# Patient Record
Sex: Male | Born: 1965 | Race: White | Hispanic: No | Marital: Married | Smoking: Never smoker
Health system: Southern US, Community
[De-identification: ages and names within clinical notes are randomized; demographics above are authoritative.]

## PROBLEM LIST (undated history)

## (undated) DIAGNOSIS — K5792 Diverticulitis of intestine, part unspecified, without perforation or abscess without bleeding: Secondary | ICD-10-CM

## (undated) DIAGNOSIS — E119 Type 2 diabetes mellitus without complications: Secondary | ICD-10-CM

## (undated) DIAGNOSIS — K611 Rectal abscess: Secondary | ICD-10-CM

## (undated) DIAGNOSIS — K529 Noninfective gastroenteritis and colitis, unspecified: Secondary | ICD-10-CM

## (undated) DIAGNOSIS — I10 Essential (primary) hypertension: Secondary | ICD-10-CM

## (undated) HISTORY — PX: PLEURAL SCARIFICATION: SHX748

## (undated) HISTORY — PX: OTHER SURGICAL HISTORY: SHX169

## (undated) HISTORY — PX: HERNIA REPAIR: SHX51

---

## 2007-12-21 ENCOUNTER — Ambulatory Visit (HOSPITAL_BASED_OUTPATIENT_CLINIC_OR_DEPARTMENT_OTHER): Admission: RE | Admit: 2007-12-21 | Discharge: 2007-12-21 | Payer: Self-pay | Admitting: Orthopedic Surgery

## 2010-06-04 ENCOUNTER — Inpatient Hospital Stay (HOSPITAL_COMMUNITY): Admission: EM | Admit: 2010-06-04 | Discharge: 2010-06-06 | Payer: Self-pay | Admitting: Emergency Medicine

## 2010-08-01 ENCOUNTER — Ambulatory Visit (HOSPITAL_COMMUNITY): Admission: RE | Admit: 2010-08-01 | Discharge: 2010-08-01 | Payer: Self-pay | Admitting: Gastroenterology

## 2011-02-16 ENCOUNTER — Inpatient Hospital Stay (HOSPITAL_COMMUNITY)
Admission: EM | Admit: 2011-02-16 | Discharge: 2011-02-17 | DRG: 183 | Discharge status: Against Medical Advice | Payer: BC Managed Care – PPO | Attending: Internal Medicine | Admitting: Internal Medicine

## 2011-02-16 ENCOUNTER — Emergency Department (HOSPITAL_COMMUNITY): Payer: BC Managed Care – PPO

## 2011-02-16 DIAGNOSIS — K5732 Diverticulitis of large intestine without perforation or abscess without bleeding: Principal | ICD-10-CM | POA: Diagnosis present

## 2011-02-16 LAB — DIFFERENTIAL
Basophils Absolute: 0 10*3/uL (ref 0.0–0.1)
Basophils Absolute: 0 10*3/uL (ref 0.0–0.1)
Basophils Absolute: 0 10*3/uL (ref 0.0–0.1)
Basophils Relative: 0 % (ref 0–1)
Basophils Relative: 0 % (ref 0–1)
Eosinophils Absolute: 0.1 10*3/uL (ref 0.0–0.7)
Eosinophils Absolute: 0 10*3/uL (ref 0.0–0.7)
Eosinophils Relative: 1 % (ref 0–5)
Eosinophils Relative: 0 % (ref 0–5)
Eosinophils Relative: 1 % (ref 0–5)
Lymphocytes Relative: 15 % (ref 12–46)
Lymphs Abs: 0.5 10*3/uL — ABNORMAL LOW (ref 0.7–4.0)
Monocytes Absolute: 0.7 10*3/uL (ref 0.1–1.0)
Monocytes Relative: 11 % (ref 3–12)
Monocytes Relative: 5 % (ref 3–12)
Neutro Abs: 6.9 10*3/uL (ref 1.7–7.7)
Neutro Abs: 3.7 10*3/uL (ref 1.7–7.7)
Neutro Abs: 7.2 10*3/uL (ref 1.7–7.7)

## 2011-02-16 LAB — CLOSTRIDIUM DIFFICILE EIA: C difficile Toxins A+B, EIA: NEGATIVE

## 2011-02-16 LAB — COMPREHENSIVE METABOLIC PANEL
ALT: 43 U/L (ref 0–53)
Albumin: 4.1 g/dL (ref 3.5–5.2)
Calcium: 8.8 mg/dL (ref 8.4–10.5)
Chloride: 101 mEq/L (ref 96–112)
Creatinine, Ser: 1.01 mg/dL (ref 0.4–1.5)
GFR calc Af Amer: >60 mL/min (ref >60)
GFR calc non Af Amer: >60 mL/min (ref >60)
Sodium: 134 mEq/L — ABNORMAL LOW (ref 135–145)
Total Protein: 7.1 g/dL (ref 6.0–8.3)

## 2011-02-16 LAB — CBC
HCT: 41.2 % (ref 39.0–52.0)
HCT: 38.9 % — ABNORMAL LOW (ref 39.0–52.0)
HCT: 39.5 % (ref 39.0–52.0)
HCT: 44.2 % (ref 39.0–52.0)
Hemoglobin: 13.8 g/dL (ref 13.0–17.0)
Hemoglobin: 15.5 g/dL (ref 13.0–17.0)
MCH: 30.3 pg (ref 26.0–34.0)
MCH: 31.7 pg (ref 26.0–34.0)
MCHC: 34.9 g/dL (ref 30.0–36.0)
MCHC: 35 g/dL (ref 30.0–36.0)
MCHC: 35 g/dL (ref 30.0–36.0)
MCV: 88.6 fL (ref 78.0–100.0)
MCV: 89.2 fL (ref 78.0–100.0)
Platelets: 133 10*3/uL — ABNORMAL LOW (ref 150–400)
Platelets: 140 10*3/uL — ABNORMAL LOW (ref 150–400)
RBC: 4.43 MIL/uL (ref 4.22–5.81)
RBC: 4.99 MIL/uL (ref 4.22–5.81)
RDW: 13.3 % (ref 11.5–15.5)
RDW: 13.4 % (ref 11.5–15.5)
RDW: 13.5 % (ref 11.5–15.5)

## 2011-02-16 LAB — BASIC METABOLIC PANEL
BUN: 12 mg/dL (ref 6–23)
BUN: 3 mg/dL — ABNORMAL LOW (ref 6–23)
CO2: 26 mEq/L (ref 19–32)
CO2: 24 mEq/L (ref 19–32)
Calcium: 9 mg/dL (ref 8.4–10.5)
Calcium: 8.6 mg/dL (ref 8.4–10.5)
Chloride: 105 mEq/L (ref 96–112)
Creatinine, Ser: 0.86 mg/dL (ref 0.4–1.5)
Creatinine, Ser: 1.02 mg/dL (ref 0.4–1.5)
Creatinine, Ser: 1.14 mg/dL (ref 0.4–1.5)
GFR calc non Af Amer: >60 mL/min (ref >60)
GFR calc non Af Amer: >60 mL/min (ref >60)
Glucose, Bld: 133 mg/dL — ABNORMAL HIGH (ref 70–99)
Glucose, Bld: 104 mg/dL — ABNORMAL HIGH (ref 70–99)
Glucose, Bld: 121 mg/dL — ABNORMAL HIGH (ref 70–99)
Glucose, Bld: 98 mg/dL (ref 70–99)
Potassium: 4.2 mEq/L (ref 3.5–5.1)
Potassium: 3.3 mEq/L — ABNORMAL LOW (ref 3.5–5.1)
Potassium: 4.1 mEq/L (ref 3.5–5.1)
Sodium: 135 mEq/L (ref 135–145)
Sodium: 140 mEq/L (ref 135–145)

## 2011-02-16 LAB — URINALYSIS, ROUTINE W REFLEX MICROSCOPIC
Glucose, UA: NEGATIVE mg/dL
Nitrite: NEGATIVE
Specific Gravity, Urine: 1.023 (ref 1.005–1.030)
Urobilinogen, UA: 0.2 mg/dL (ref 0.0–1.0)
Urobilinogen, UA: 0.2 mg/dL (ref 0.0–1.0)
pH: 6 (ref 5.0–8.0)

## 2011-02-16 LAB — FECAL LACTOFERRIN, QUANT

## 2011-02-16 LAB — URINE MICROSCOPIC-ADD ON

## 2011-02-16 LAB — LIPASE, BLOOD: Lipase: 22 U/L (ref 11–59)

## 2011-02-16 LAB — HEMOGLOBIN A1C: Mean Plasma Glucose: 114 mg/dL (ref <117)

## 2011-02-16 MED ORDER — IOHEXOL 300 MG/ML  SOLN
125.0000 mL | Freq: Once | INTRAMUSCULAR | Status: AC | PRN
  Administered 2011-02-16: 125 mL via INTRAVENOUS

## 2011-02-17 LAB — CBC
MCH: 29.4 pg (ref 26.0–34.0)
MCV: 88.1 fL (ref 78.0–100.0)
Platelets: 195 10*3/uL (ref 150–400)
RDW: 12.6 % (ref 11.5–15.5)

## 2011-02-17 LAB — URINE CULTURE: Culture: NO GROWTH

## 2011-02-17 LAB — BASIC METABOLIC PANEL
BUN: 8 mg/dL (ref 6–23)
Chloride: 100 mEq/L (ref 96–112)
Creatinine, Ser: 0.94 mg/dL (ref 0.4–1.5)
GFR calc non Af Amer: >60 mL/min (ref >60)
Glucose, Bld: 113 mg/dL — ABNORMAL HIGH (ref 70–99)

## 2011-02-18 NOTE — Discharge Summary (Signed)
  Steven James, Steven James             ACCOUNT NO.:  [id number]  MEDICAL RECORD NO.:  [id number]           PATIENT TYPE:  I  LOCATION:  1308                         FACILITY:  [facility]  PHYSICIAN:  Hillery Aldo, M.D.   DATE OF BIRTH:  09/11/1966  DATE OF ADMISSION:  02/16/2011 DATE OF DISCHARGE:  02/17/2011                              DISCHARGE SUMMARY   PRIMARY CARE PHYSICIAN:  Windle Guard, M.D.  DISCHARGE DIAGNOSES: 1. Sigmoid diverticulitis with microperforation, inadequately treated     secondary to the patient leaving against medical advice. 2. History of bronchitis.  DISCHARGE MEDICATIONS:  No medications prescribed at discharge as the patient left AMA.  PREADMISSION MEDICATIONS: 1. Adderall 20 mg p.o. t.i.d. 2. Hyoscyamine p.r.n.  CONSULTATIONS:  Telephone consultation made with Dr. Claud Kelp.  BRIEF ADMISSION HISTORY OF PRESENT ILLNESS:  The patient is a 45 year old male who presented to the hospital with a chief complaint of abdominal pain, nausea, vomiting, and intermittent chills associated with diaphoresis.  Upon initial evaluation in the emergency department, the patient was found to have acute diverticulitis with microperforation and was referred to the hospitalist service for further inpatient evaluation and treatment.  PROCEDURES AND DIAGNOSTIC STUDIES: 1. Acute abdominal series on February 16, 2011, showed no evidence of     active pulmonary disease.  Nonobstructive bowel gas pattern.     Nondilated gas-filled small bowel loops in the left upper quadrant     suggestive of localized ileus or gastroenteritis. 2. CT scan of the abdomen and pelvis on February 16, 2011, showed acute     sigmoid diverticulitis.  Small extraluminal gas bowel consistent     with microperforation.  Free fluid layering in the pelvis and a     small right lower quadrant fluid collection noted.  DISCHARGE LABORATORY VALUES:  Urine cultures were negative.  Sodium was 137, potassium  3.9, chloride 100, bicarb 30, BUN 8, creatinine 0.94, glucose 113, calcium 8.9.  White blood cell count was 8.7, hemoglobin 13.9, hematocrit 41.6, platelets 195.  HOSPITAL COURSE BY PROBLEM:  Sigmoid diverticulitis:  The patient was admitted and put on Invanz after telephone consultation was made with Dr. Derrell Lolling.  Plan was to continue IV antibiotics and to slowly advance his diet as tolerated unless his condition deteriorated in which case a formal surgical consultation would be obtained. Unfortunately, the patient had family issues and left against medical advice on February 17, 2011. History of bronchitis:  No active issues during the course of his hospital stay.  DISPOSITION:  The patient left against medical advice.     Hillery Aldo, M.D.     CR/MEDQ  D:  02/18/2011  T:  02/18/2011  Job:  [id number]  cc:   Windle Guard, M.D. Fax: [phone]  Electronically Signed by Hillery Aldo M.D. on 02/18/2011 82:95:62 PM

## 2011-02-18 NOTE — H&P (Signed)
Steven James, Steven James             ACCOUNT NO.:  [id number]  MEDICAL RECORD NO.:  [id number]           PATIENT TYPE:  I  LOCATION:  1308                         FACILITY:  [facility]  PHYSICIAN:  Hillery Aldo, M.D.   DATE OF BIRTH:  [birth year]  DATE OF ADMISSION:  02/16/2011 DATE OF DISCHARGE:                             HISTORY & PHYSICAL   PRIMARY CARE PHYSICIAN:  Windle Guard, M.D.  CHIEF COMPLAINT:  Abdominal pain.  HISTORY OF PRESENT ILLNESS:  The patient is a 45 year old male with a past medical history of colitis, treated approximately 1 year ago, who presented to the hospital with a 4-day history of gradual onset of abdominal pain.  The pain has been intermittently accompanied by dry heaves and 1 episode of nausea and vomiting approximately 3 days ago. He reports intermittent chills and diaphoresis, though he is unsure if he has been febrile.  Because he has not had a thermometer to check his temperature.  He describes the abdominal pain is intermittent and sharp, 10/10, at it is worst pain, and located mainly in the bilateral lower quadrants.  The pain at times radiates to the scrotum and has been associated with myalgias as well.  He last moved his bowels approximately 4 days ago and used a Fleet Enema to attempt to evacuate, but was fairly unsuccessful.  He reports no blood in his stool, black stools, or hematemesis.  During his last episode of colitis 1 year ago, he reportedly had a colonoscopy that was negative for any evidence of inflammatory bowel disease.  Upon evaluation here in the emergency department, the patient had a CT scan done, which showed sigmoid diverticulitis with a microperforation and he subsequently has been referred to the hospitalist service for further inpatient evaluation and treatment.  PAST MEDICAL HISTORY: 1. Bronchitis. 2. Pneumothorax 3. History of colitis, approximately 1 year ago, status post     colonoscopy, negative for inflammatory  bowel disease.  PAST SURGICAL HISTORY: 1. Carpal tunnel release. 2. Hernia repair.  FAMILY HISTORY:  The patient's father is alive at 63, has diverticulosis and a history of prostate cancer.  The patient's mother is alive at 15 and has congestive heart failure, COPD, hypertension, and a brain tumor that they think is benign.  He does have a 1 half-sister who he does not keep in touch with.  SOCIAL HISTORY:  The patient is married and lives in [place] with his wife.  He has worked as a Radiation protection practitioner and a Buyer, retail in the past, but is currently unemployed.  He smoked cigars and a pipe for 15 years, but quit 4 years ago, and now only rarely smokes cigars.  He denies any drug use and drinks alcohol moderately on social occasions.  ALLERGIES:  HORSE SERUM causes anaphylaxis.  CURRENT MEDICATIONS: 1. Adderall 20 mg p.o. t.i.d. 2. Hyoscyamine prescribed yesterday for abdominal spasm, but has not     consistently taken.  REVIEW OF SYSTEMS:  Comprehensive 14-point review of systems is as noted in the elements of the HPI and past medical history above, otherwise completely negative.  PHYSICAL EXAMINATION:  VITAL SIGNS:  Temperature 98.3, pulse 73,  respirations 20, blood pressure 125/71, O2 saturation 96% on room air. GENERAL:  A well-developed, well-nourished, slightly obese Caucasian male in no acute distress. HEENT:  Normocephalic, atraumatic.  PERRLA.  EOMI.  Oropharynx is clear. NECK:  Supple, no thyromegaly, no lymphadenopathy, no jugular venous distention. CHEST:  Lungs clear to auscultation bilaterally with good air movement. No wheezes, rhonchi, or rales. HEART:  Regular rate, rhythm.  No murmurs, rubs, or gallops. ABDOMEN:  Soft in the upper quadrant and slightly distended in the lower quadrants.  He is diffusely tender to palpation.  Bowel sounds are positive. EXTREMITIES:  No clubbing, edema, or cyanosis. SKIN:  Warm and dry.  No rashes. NEUROLOGIC:  Alert and  oriented x3.  Cranial nerves II through XII grossly intact.  Nonfocal.  DATA REVIEW: 1. Acute abdominal series shows no evidence of active pulmonary     disease.  Nonobstructive bowel gas pattern.  Nondilated gas-filled     small bowel loops in the left upper quadrant, suggestive of a     localized ileus or gastroenteritis. 2. CT scan of the abdomen and pelvis showed acute sigmoid     diverticulitis with a small amount of extraluminal gas consistent     with mitral perforation.  Free-fluid layering in the pelvis and a     small right lower quadrant fluid collection noted.  LABORATORY DATA:  White blood cell count is 9.5, hemoglobin 14.2, hematocrit 41.2, platelets 189.  Sodium is 136, potassium 4.2, chloride 103, bicarb 26, BUN 12, creatinine 0.86, calcium 9.0, glucose 133. Urinalysis is negative for nitrites and leukocytes.  ASSESSMENT/PLAN: 1. Sigmoid diverticulitis with microperforation:  The patient will be     admitted and started on empiric antibiotics consisting of Invanz     per recommendations of Dr. Claud Kelp.  The patient does not     have any evidence of a colovesical fistula, given his clean     urinalysis and his abdominal exam is benign.  He does not appear to     have acute abdomen and he is nontoxic in appearing and after     discussing the case with Dr. Derrell Lolling, the plan will be to admit him     for bowel rest, IV antibiotics, and surgical consultation on a     formal basis if his condition deteriorates over the next 24 hours.     The patient will also be put on stool softeners, given his chronic     constipation and family history of diverticulosis. 2. History of bronchitis:  The patient's lungs are currently clear     with no evidence of bronchospasm. 3. Prophylaxis:  The patient will be placed on PAS hoses for deep     venous thrombosis prophylaxis in case he does have to go to     surgery.  Time spent on admission including face-to-face time equals  approximately 1 hour.     Hillery Aldo, M.D.     CR/MEDQ  D:  02/16/2011  T:  02/17/2011  Job:  [id number]  cc:   Windle Guard, M.D. Fax: [phone]  Electronically Signed by Hillery Aldo M.D. on 02/18/2011 03:27:37 PM

## 2011-04-15 NOTE — Op Note (Signed)
NAMEBARRE, AYDELOTT             ACCOUNT NO.:  [id number]   MEDICAL RECORD NO.:  [id number]          PATIENT TYPE:  AMB   LOCATION:  DSC                          FACILITY:  [facility]   PHYSICIAN:  Katy Fitch. Sypher, M.D. DATE OF BIRTH:  Jun 20, 1966   DATE OF PROCEDURE:  12/21/2007  DATE OF DISCHARGE:                               OPERATIVE REPORT   PREOPERATIVE DIAGNOSIS:  Painful right hand aggravated by lifting, with  magnetic resonance imaging evidence of mass within median nerve right  carpal tunnel, with episodic median nerve compression symptoms.   POSTOPERATIVE DIAGNOSIS:  Large-caliber persistent median artery,  measuring approximately 4 mm off tourniquet.   OPERATION:  Decompression of right median nerve, and confirmation of  persistent median artery without thrombosis.   OPERATIONS:  Katy Fitch. Sypher, M.D.   ASSISTANT:  Marveen Reeks. Dasnoit PA-C.   ANESTHESIA:  General by LMA.   SUPERVISING ANESTHESIOLOGIST:  Zenon Mayo, M.D.   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   He was referred through the courtesy of Dr. Louanna Raw for evaluation  and management of this predicament.   Clinical examination revealed elements of probable carpal tunnel  syndrome.  However, he did not have the usual provocative symptoms.   Due to his persistent pain, an MRI of the right hand and wrist was  obtained which documented a T2 enhancing mass within the region of the  median nerve.   This could represent a hamartoma, a ganglion within the nerve, or a  possible persistent median artery.   Given the fact that we could not palpate this mass through the  transverse carpal ligament, we recommended exploration with either  resection or decompression.  After informed consent. Mr. Leiner is  brought to the operating room at this time.   PROCEDURE:  Steven James is brought to the operating room and  placed  in the supine position upon the operating table.   Following the induction of general anesthesia by LMA technique, the  right arm was prepped with Betadine soap and solution and sterilely  draped.  A pneumatic tourniquet was applied to the proximal right  brachium.   Following exsanguination of the right arm with Esmarch bandage, the  arterial tourniquet was inflated to 230 mmHg.  The procedure commenced  with a traditional carpal tunnel incision, paralleling the thenar  crease.  Subcutaneous tissues were carefully divided, taking care to  identify the palmar fascia.  The fascia was split in the line of its  fibers to reveal the common sensory branches of the median nerve and the  superficial palmar arch.   The transverse carpal ligament was then isolated from the median nerve  elements and released along its ulnar border.  A Joseph skin hook was  used elevate the radial leaflet of the transverse carpal ligament,  followed by careful inspection of the median nerve.  A Henner elevator  was used to gently tease synovium off of the nerve, and immediately a  large-caliber vascular structure was noted.  Initially, this had  the  appearance of a possible ganglion.  However, upon further dissection, it  was noted be a large-caliber artery measuring 3-4 mm in diameter, on  tourniquet, with accompanying veins.  Care was taken not to disturb the  epineurium.   After this was confirmed, photographic documentation of the vascular  anomaly was accomplished with a digital camera, followed by release of  the tourniquet.  Off tourniquet, the caliber of the vessel increased to  over 4 mm.  Bleeding was controlled with bipolar cautery under saline.   The canal was inspected, and no other masses or predicaments noted.   Our plan for treatment of this will be simple decompression by release  of the transverse carpal ligament.  All visible bleeding points were  electrocauterized with bipolar  current, followed by repair the skin with  segmental intradermal 3-0 Prolene suture.   A compressive dressing was applied with a volar plaster splint  incorporated, maintaining the wrist in 20 degrees of dorsiflexion.  There were no apparent complications.   For aftercare, Mr. Longton is provided a prescription for Percocet 5  mg 1 p.o. q.4-6 h. p.r.n. pain, 20 tablets, without refill.   I will see him back for followup in approximately 1 week.  The nature of  his predicament was explained to Mr. Torosyan wife in detail.      Katy Fitch Sypher, M.D.  Electronically Signed     RVS/MEDQ  D:  12/21/2007  T:  12/21/2007  Job:  [id number]   cc:   Louanna Raw

## 2011-08-21 LAB — POCT HEMOGLOBIN-HEMACUE: Hemoglobin: 14.7

## 2011-09-03 IMAGING — CR DG ABDOMEN ACUTE W/ 1V CHEST
4 series · 4 of 4 positions shown · non-contrast
Comparison: None.

CLINICAL DATA: Abdominal pain.

ACUTE ABDOMEN SERIES (ABDOMEN 2 VIEW & CHEST 1 VIEW)

[w chest pa]
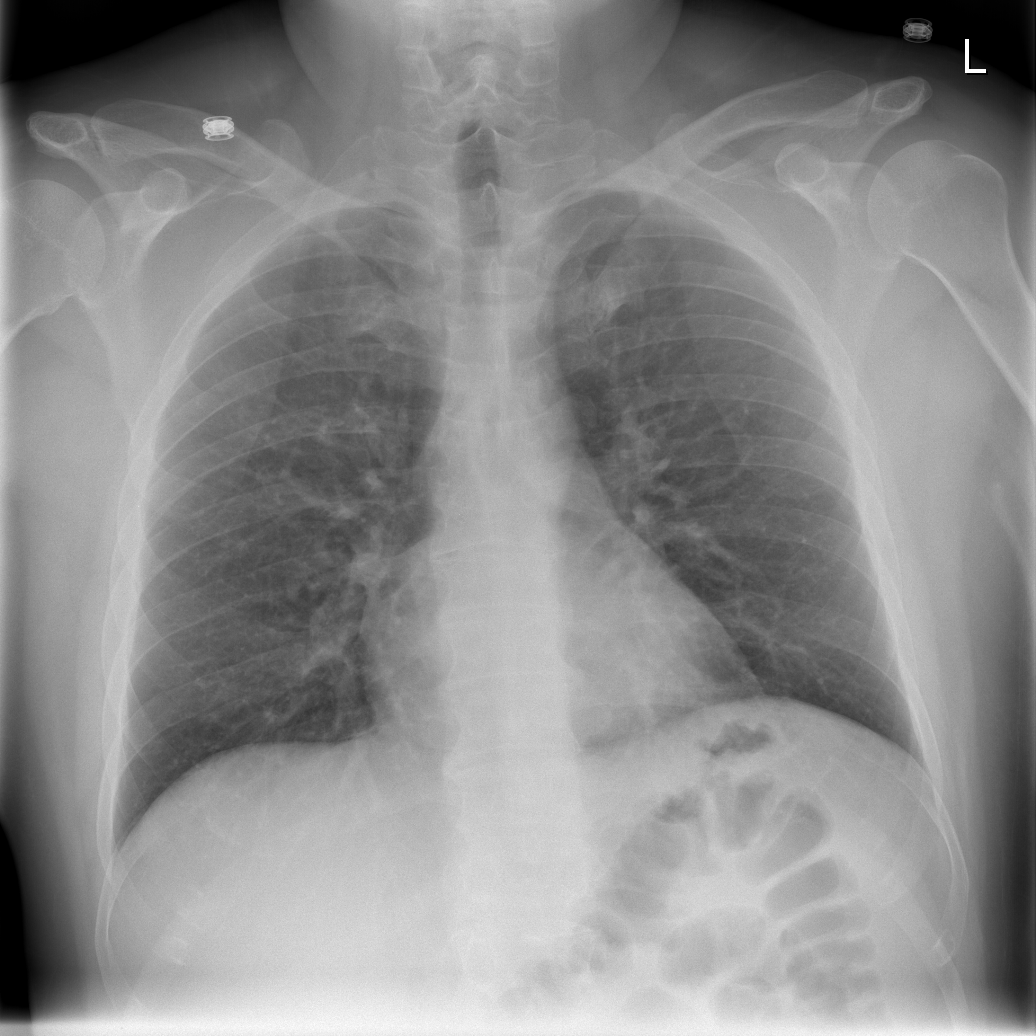

[w abdomen upright *]
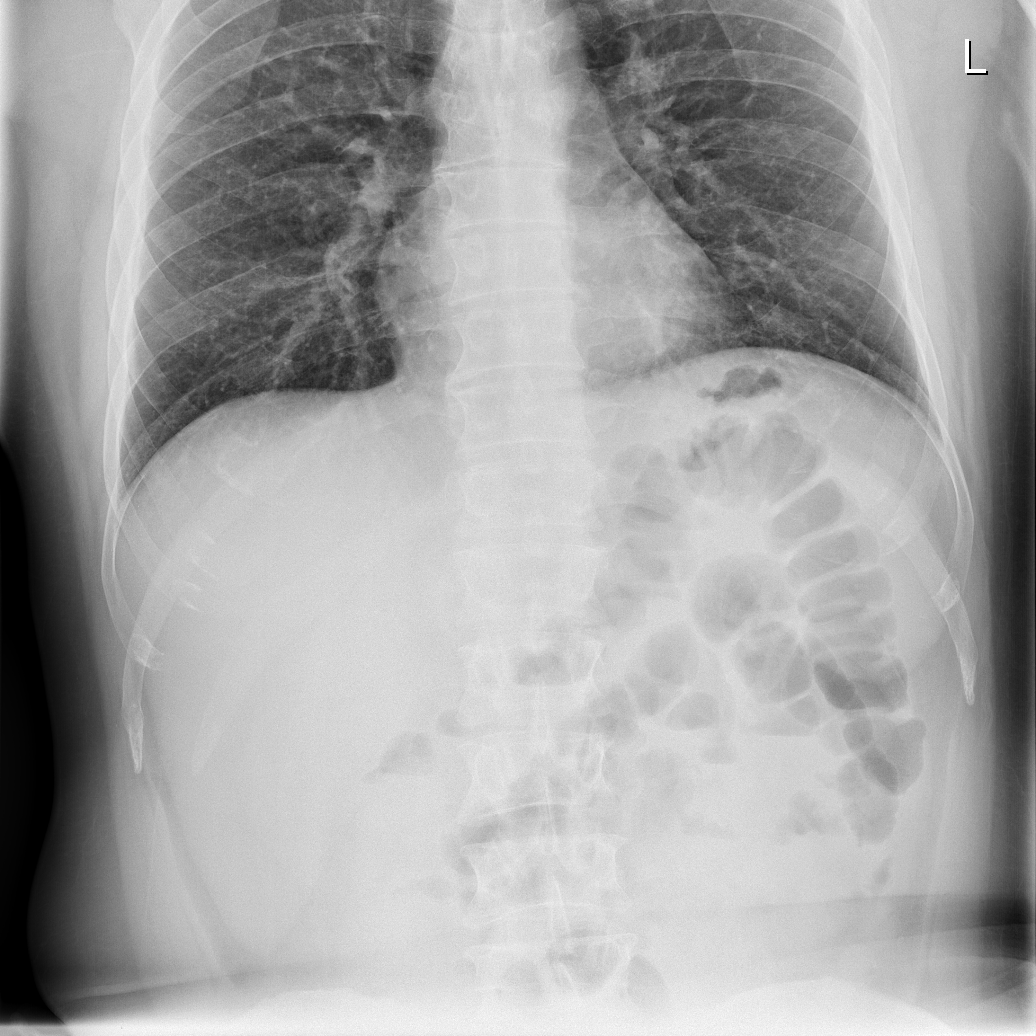

[t abdomen supine (1 of 2)]
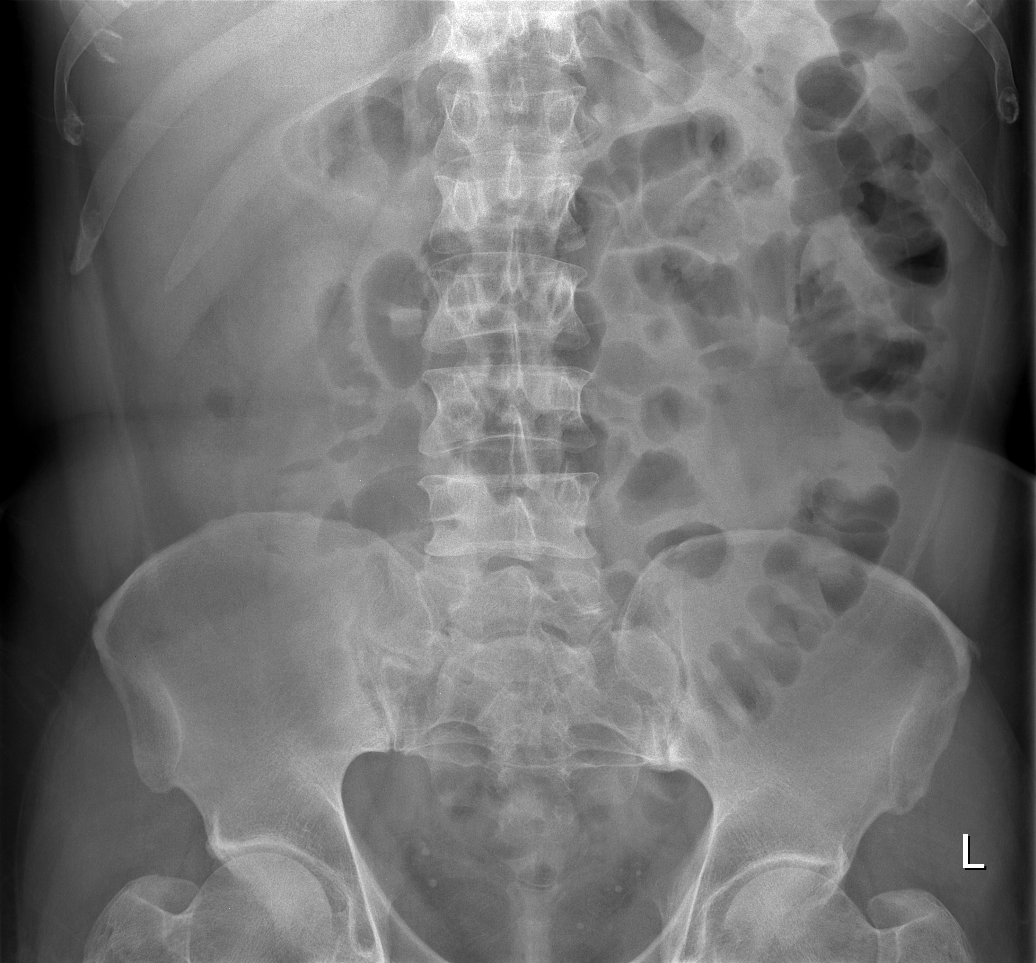

[t abdomen supine (2 of 2)]
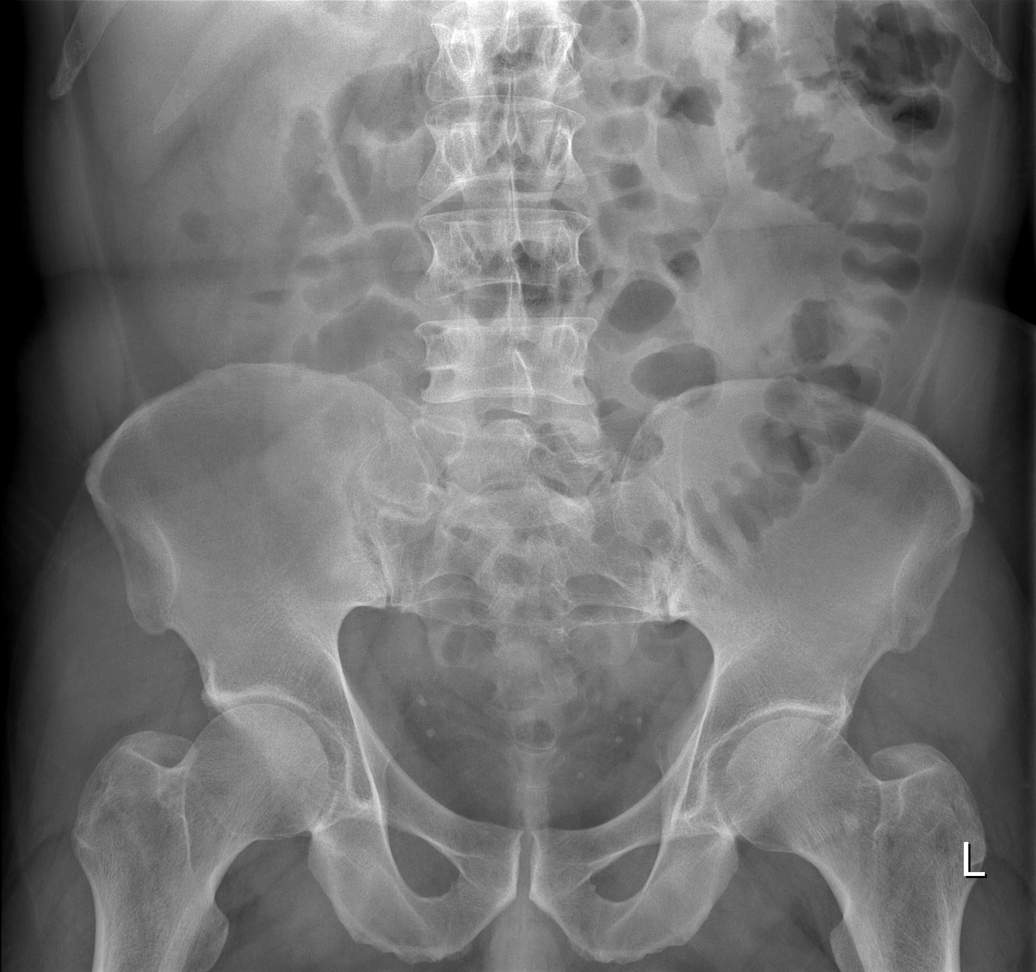

[4 of 4 positions shown; findings below may reference images not displayed]

FINDINGS: The heart and lungs appear normal.  There is no free air
or free fluid in the abdomen.  There is air scattered throughout
nondistended loops of large or small bowel.  There are numerous
calcifications in the pelvis most likely phleboliths.  No bony
abnormalities.
IMPRESSION: Benign-appearing abdomen and chest.

## 2014-05-05 ENCOUNTER — Emergency Department (HOSPITAL_COMMUNITY)
Admission: EM | Admit: 2014-05-05 | Discharge: 2014-05-05 | Disposition: A | Discharge status: Home / Self Care | Payer: BC Managed Care – PPO | Attending: Emergency Medicine | Admitting: Emergency Medicine

## 2014-05-05 ENCOUNTER — Encounter (HOSPITAL_COMMUNITY): Payer: Self-pay | Admitting: Emergency Medicine

## 2014-05-05 DIAGNOSIS — K5732 Diverticulitis of large intestine without perforation or abscess without bleeding: Secondary | ICD-10-CM | POA: Insufficient documentation

## 2014-05-05 DIAGNOSIS — N410 Acute prostatitis: Secondary | ICD-10-CM

## 2014-05-05 DIAGNOSIS — Z79899 Other long term (current) drug therapy: Secondary | ICD-10-CM | POA: Insufficient documentation

## 2014-05-05 DIAGNOSIS — K5289 Other specified noninfective gastroenteritis and colitis: Secondary | ICD-10-CM | POA: Insufficient documentation

## 2014-05-05 DIAGNOSIS — Z7901 Long term (current) use of anticoagulants: Secondary | ICD-10-CM | POA: Insufficient documentation

## 2014-05-05 DIAGNOSIS — K612 Anorectal abscess: Secondary | ICD-10-CM | POA: Insufficient documentation

## 2014-05-05 DIAGNOSIS — M549 Dorsalgia, unspecified: Secondary | ICD-10-CM | POA: Insufficient documentation

## 2014-05-05 HISTORY — DX: Diverticulitis of intestine, part unspecified, without perforation or abscess without bleeding: K57.92

## 2014-05-05 HISTORY — DX: Rectal abscess: K61.1

## 2014-05-05 HISTORY — DX: Noninfective gastroenteritis and colitis, unspecified: K52.9

## 2014-05-05 LAB — URINALYSIS, ROUTINE W REFLEX MICROSCOPIC
Bilirubin Urine: NEGATIVE
Glucose, UA: >1000 mg/dL — AB
Hgb urine dipstick: NEGATIVE
Ketones, ur: NEGATIVE mg/dL
Leukocytes, UA: NEGATIVE
Nitrite: NEGATIVE
Protein, ur: NEGATIVE mg/dL
Specific Gravity, Urine: 1.04 — ABNORMAL HIGH (ref 1.005–1.030)
Urobilinogen, UA: 0.2 mg/dL (ref 0.0–1.0)
pH: 6 (ref 5.0–8.0)

## 2014-05-05 LAB — URINE MICROSCOPIC-ADD ON

## 2014-05-05 MED ORDER — OXYCODONE-ACETAMINOPHEN 5-325 MG PO TABS
2.0000 | ORAL_TABLET | Freq: Once | ORAL | Status: DC
  Filled 2014-05-05: qty 2

## 2014-05-05 MED ORDER — OXYCODONE-ACETAMINOPHEN 5-325 MG PO TABS: 1.0000 | ORAL_TABLET | ORAL | Status: AC | PRN

## 2014-05-05 MED ORDER — CIPROFLOXACIN HCL 500 MG PO TABS
500.0000 mg | ORAL_TABLET | Freq: Once | ORAL | Status: AC
  Administered 2014-05-05: 500 mg via ORAL
  Filled 2014-05-05: qty 1

## 2014-05-05 MED ORDER — CIPROFLOXACIN HCL 500 MG PO TABS: 500.0000 mg | ORAL_TABLET | Freq: Two times a day (BID) | ORAL | Status: AC

## 2014-05-05 MED ORDER — IBUPROFEN 800 MG PO TABS
800.0000 mg | ORAL_TABLET | Freq: Once | ORAL | Status: AC
  Administered 2014-05-05: 800 mg via ORAL
  Filled 2014-05-05: qty 1

## 2014-05-05 NOTE — ED Notes (Signed)
Dr. Kohut at bedside 

## 2014-05-05 NOTE — ED Provider Notes (Signed)
CSN: [id number]     Arrival date & time 05/05/14  [id number] History   First MD Initiated Contact with Patient 05/05/14 [phone]     Chief Complaint  Patient presents with  . Rectal Pain   47yM with rectal pain. Onset about 4 days ago. Gradual and progressively worsening. Constant. Reports past history of rectal abscess? with similar symptoms. No fever or chills. No abdominal pain. Urine has been darker in color than normal and strong smelling, otherwise no urinary complaints.   (Consider location/radiation/quality/duration/timing/severity/associated sxs/prior Treatment) HPI  Past Medical History  Diagnosis Date  . Rectal abscess   . Diverticulitis   . Colitis    Past Surgical History  Procedure Laterality Date  . Rectal abscess    . Pleural scarification    . Hernia repair     No family history on file. History  Substance Use Topics  . Smoking status: Never Smoker   . Smokeless tobacco: Not on file  . Alcohol Use: Yes     Comment: socially    Review of Systems  All systems reviewed and negative, other than as noted in HPI.   Allergies  Horse-derived products  Home Medications   Prior to Admission medications   Medication Sig Start Date End Date Taking? Authorizing Provider  acetaminophen (TYLENOL) 500 MG tablet Take 1,000 mg by mouth every 8 (eight) hours as needed for mild pain or headache.   Yes Historical Provider, MD  amphetamine-dextroamphetamine (ADDERALL) 10 MG tablet Take 10 mg by mouth 3 (three) times daily.   Yes Historical Provider, MD  FLUoxetine (PROZAC) 10 MG capsule Take 10 mg by mouth daily.   Yes Historical Provider, MD  losartan (COZAAR) 25 MG tablet Take 25 mg by mouth daily.   Yes Historical Provider, MD  zolpidem (AMBIEN) 10 MG tablet Take 10 mg by mouth at bedtime as needed for sleep.   Yes Historical Provider, MD  ciprofloxacin (CIPRO) 500 MG tablet Take 1 tablet (500 mg total) by mouth every 12 (twelve) hours. 05/05/14   Raeford RazorStephen Owens Hara, MD   oxyCODONE-acetaminophen (PERCOCET/ROXICET) 5-325 MG per tablet Take 1-2 tablets by mouth every 4 (four) hours as needed for severe pain. 05/05/14   Raeford RazorStephen Jakeya Gherardi, MD   BP 118/71  Pulse 86  Temp(Src) 98.2 F (36.8 C) (Oral)  Resp 18  Ht 6' (1.829 m)  Wt 240 lb (108.863 kg)  BMI 32.54 kg/m2  SpO2 95% Physical Exam  Nursing note and vitals reviewed. Constitutional: He appears well-developed and well-nourished. No distress.  HENT:  Head: Normocephalic and atraumatic.  Eyes: Conjunctivae are normal. Right eye exhibits no discharge. Left eye exhibits no discharge.  Neck: Neck supple.  Cardiovascular: Normal rate, regular rhythm and normal heart sounds.  Exam reveals no gallop and no friction rub.   No murmur heard. Pulmonary/Chest: Effort normal and breath sounds normal. No respiratory distress.  Abdominal: Soft. He exhibits no distension. There is no tenderness.  Genitourinary:  No external lesions noted. Normal tone. Prostate enlarged, increased warmth, exquisitely tender.   Musculoskeletal: He exhibits no edema and no tenderness.  Neurological: He is alert.  Skin: Skin is warm and dry.  Psychiatric: He has a normal mood and affect. His behavior is normal. Thought content normal.    ED Course  Procedures (including critical care time) Labs Review Labs Reviewed  URINALYSIS, ROUTINE W REFLEX MICROSCOPIC - Abnormal; Notable for the following:    Specific Gravity, Urine 1.040 (*)    Glucose, UA >1000 (*)  All other components within normal limits  URINE CULTURE  URINE MICROSCOPIC-ADD ON    Imaging Review No results found.   EKG Interpretation None      MDM   Final diagnoses:  Acute prostatitis    47yM with rectal pain, lower back pain and strong smelling urine. On exam exquisitely tender over prostate which is enlarged, warm. Suspected prostatitis, although subsequent UA doesn't necessarily support this. Pt reports history of "rectal abscess" but this was initially  I&D'd at bedside. No hemorrhoids or other explanatory findings. No tenderness on DRE aside from prostate. Will tx for possible prostatitis. Send urine for culture. Abdomen benign. Afebrile. Triage vitals with HR of 104. HR on my exam normal and again upon DC. PRN pain medication. Return precautions discussed.     Raeford Razor, MD 05/13/14 [phone]

## 2014-05-05 NOTE — ED Notes (Signed)
Patient c/o rectal/buttocks pain x3-4 days. Patient states he has a history of rectal abscess.

## 2014-05-05 NOTE — ED Notes (Signed)
Patient ambulatory to bathroom unassisted.

## 2014-05-05 NOTE — ED Notes (Signed)
Pharmacy tech at bedside 

## 2014-05-05 NOTE — ED Notes (Signed)
Pt c/o pain in rectum x 3 -4 days; pt states that it just starting hurting that there was no injury or event that caused the pain; pt states he has had some minimal bright red blood at times; pt states that he has had a rectal abscess in the past and that this feels the same

## 2014-05-07 LAB — URINE CULTURE

## 2021-12-08 ENCOUNTER — Other Ambulatory Visit: Payer: Self-pay

## 2021-12-08 ENCOUNTER — Ambulatory Visit
Admission: EM | Admit: 2021-12-08 | Discharge: 2021-12-08 | Disposition: A | Discharge status: Home / Self Care | Payer: BC Managed Care – PPO | Attending: Physician Assistant | Admitting: Physician Assistant

## 2021-12-08 ENCOUNTER — Encounter: Payer: Self-pay | Admitting: Emergency Medicine

## 2021-12-08 ENCOUNTER — Ambulatory Visit (INDEPENDENT_AMBULATORY_CARE_PROVIDER_SITE_OTHER): Payer: BC Managed Care – PPO

## 2021-12-08 DIAGNOSIS — L03012 Cellulitis of left finger: Secondary | ICD-10-CM

## 2021-12-08 MED ORDER — DOXYCYCLINE HYCLATE 100 MG PO CAPS: 100.0000 mg | ORAL_CAPSULE | Freq: Two times a day (BID) | ORAL | 0 refills | Status: DC

## 2021-12-08 MED ORDER — DOXYCYCLINE HYCLATE 100 MG PO CAPS
100.0000 mg | ORAL_CAPSULE | Freq: Two times a day (BID) | ORAL | 0 refills | Status: DC
Start: 2021-12-08 — End: 2023-03-29

## 2021-12-08 NOTE — ED Triage Notes (Signed)
Pt said x 1` month ago had a piece of metal in the left index finger and thinking something was in the finger. Has been working on trying to work with puncture on the finger but seems to be getting worse over time.

## 2021-12-08 NOTE — ED Provider Notes (Signed)
[facility]    CSN: [id number] Arrival date & time: 12/08/21  0818      History   Chief Complaint Chief Complaint  Patient presents with   Finger Injury    HPI Steven James is a 56 y.o. male.   Patient here today for evaluation of the left index finger distal swelling and erythema that seems to be worsening.  He states about a month ago he had a piece of metal in his left index finger he has been trying to work with the puncture wound but symptoms seem to be worsening.  He does not report fever.  He is a diabetic.  The history is provided by the patient.   Past Medical History:  Diagnosis Date   Colitis    Diverticulitis    Rectal abscess     There are no problems to display for this patient.   Past Surgical History:  Procedure Laterality Date   HERNIA REPAIR     PLEURAL SCARIFICATION     rectal abscess         Home Medications    Prior to Admission medications   Medication Sig Start Date End Date Taking? Authorizing Provider  doxycycline (VIBRAMYCIN) 100 MG capsule Take 1 capsule (100 mg total) by mouth 2 (two) times daily. 12/08/21  Yes Francene Finders, PA-C  acetaminophen (TYLENOL) 500 MG tablet Take 1,000 mg by mouth every 8 (eight) hours as needed for mild pain or headache.    [provider]  amphetamine-dextroamphetamine (ADDERALL) 10 MG tablet Take 10 mg by mouth 3 (three) times daily.    [provider]  ciprofloxacin (CIPRO) 500 MG tablet Take 1 tablet (500 mg total) by mouth every 12 (twelve) hours. 05/05/14   Virgel Manifold, MD  FLUoxetine (PROZAC) 10 MG capsule Take 10 mg by mouth daily.    [provider]  losartan (COZAAR) 25 MG tablet Take 25 mg by mouth daily.    [provider]  oxyCODONE-acetaminophen (PERCOCET/ROXICET) 5-325 MG per tablet Take 1-2 tablets by mouth every 4 (four) hours as needed for severe pain. 05/05/14   Virgel Manifold, MD  zolpidem (AMBIEN) 10 MG tablet Take 10 mg by mouth at  bedtime as needed for sleep.    [provider]    Family History History reviewed. No pertinent family history.  Social History Social History   Tobacco Use   Smoking status: Never  Substance Use Topics   Alcohol use: Yes    Comment: socially   Drug use: No     Allergies   Horse-derived products   Review of Systems Review of Systems  Constitutional:  Negative for chills and fever.  Eyes:  Negative for discharge and redness.  Respiratory:  Negative for shortness of breath.   Musculoskeletal:  Negative for joint swelling.  Skin:  Positive for color change and wound.    Physical Exam Triage Vital Signs ED Triage Vitals  Enc Vitals Group     BP 12/08/21 0831 (!) 156/81     Pulse Rate 12/08/21 0831 92     Resp 12/08/21 0831 16     Temp 12/08/21 0831 98.2 F (36.8 C)     Temp Source 12/08/21 0831 Oral     SpO2 12/08/21 0831 97 %     Weight --      Height --      Head Circumference --      Peak Flow --      Pain Score 12/08/21  [id number]     Pain Loc --      Pain Edu? --      Excl. in [place]? --    No data found.  Updated Vital Signs BP (!) 156/81 (BP Location: Right Arm)    Pulse 92    Temp 98.2 F (36.8 C) (Oral)    Resp 16    SpO2 97%      Physical Exam Vitals and nursing note reviewed.  Constitutional:      General: He is not in acute distress.    Appearance: Normal appearance. He is not ill-appearing.  HENT:     Head: Normocephalic and atraumatic.  Eyes:     Conjunctiva/sclera: Conjunctivae normal.  Cardiovascular:     Rate and Rhythm: Normal rate.  Pulmonary:     Effort: Pulmonary effort is normal.  Skin:    Comments: Swelling and erythema noted to distal left index finger. No active bleeding or drainage noted.   Neurological:     Mental Status: He is alert.     UC Treatments / Results  Labs (all labs ordered are listed, but only abnormal results are displayed) Labs Reviewed - No data to display  EKG   Radiology DG Finger Index  Left  Result Date: 12/08/2021 CLINICAL DATA:  Nonhealing wound distal index finger. EXAM: LEFT INDEX FINGER 2+V COMPARISON:  None. FINDINGS: No evidence for fracture or dislocation. Soft tissue irregularity noted distal index finger. No underlying bony destruction. No radiopaque soft tissue foreign body. No soft tissue gas by x-ray. IMPRESSION: 1. No acute bony abnormality. 2. Soft tissue irregularity noted distally. No radiopaque soft tissue foreign body or soft tissue gas. Electronically Signed   By: Misty Stanley M.D.   On: 12/08/2021 09:12    Procedures Procedures (including critical care time)  Medications Ordered in UC Medications - No data to display  Initial Impression / Assessment and Plan / UC Course  I have reviewed the triage vital signs and the nursing notes.  Pertinent labs & imaging results that were available during my care of the patient were reviewed by me and considered in my medical decision making (see chart for details).    Antibiotics prescribed to treat suspected cellulitis. No FB on xray. Recommend follow up with any further concerns or if symptoms fail to improve with treatment.   Final Clinical Impressions(s) / UC Diagnoses   Final diagnoses:  Cellulitis of finger of left hand   Discharge Instructions   None    ED Prescriptions     Medication Sig Dispense Auth. Provider   doxycycline (VIBRAMYCIN) 100 MG capsule Take 1 capsule (100 mg total) by mouth 2 (two) times daily. 20 capsule Francene Finders, PA-C      PDMP not reviewed this encounter.   Francene Finders, PA-C 12/08/21 [phone]

## 2022-10-31 ENCOUNTER — Ambulatory Visit
Admission: EM | Admit: 2022-10-31 | Discharge: 2022-10-31 | Disposition: A | Discharge status: Home / Self Care | Payer: BC Managed Care – PPO

## 2022-10-31 NOTE — ED Notes (Signed)
Called in lobby no answered.  

## 2023-03-29 ENCOUNTER — Emergency Department (HOSPITAL_COMMUNITY)
Admission: EM | Admit: 2023-03-29 | Discharge: 2023-03-29 | Disposition: A | Discharge status: Home / Self Care | Payer: BC Managed Care – PPO | Attending: Emergency Medicine | Admitting: Emergency Medicine

## 2023-03-29 ENCOUNTER — Emergency Department (HOSPITAL_COMMUNITY): Payer: BC Managed Care – PPO

## 2023-03-29 ENCOUNTER — Encounter (HOSPITAL_COMMUNITY): Payer: Self-pay

## 2023-03-29 DIAGNOSIS — K610 Anal abscess: Secondary | ICD-10-CM | POA: Diagnosis present

## 2023-03-29 DIAGNOSIS — E876 Hypokalemia: Secondary | ICD-10-CM | POA: Diagnosis not present

## 2023-03-29 HISTORY — DX: Type 2 diabetes mellitus without complications: E11.9

## 2023-03-29 HISTORY — DX: Essential (primary) hypertension: I10

## 2023-03-29 LAB — BASIC METABOLIC PANEL
Anion gap: 14 (ref 5–15)
BUN: 15 mg/dL (ref 6–20)
CO2: 21 mmol/L — ABNORMAL LOW (ref 22–32)
Calcium: 9 mg/dL (ref 8.9–10.3)
Chloride: 101 mmol/L (ref 98–111)
Creatinine, Ser: 0.82 mg/dL (ref 0.61–1.24)
GFR, Estimated: >60 mL/min (ref >60)
Glucose, Bld: 127 mg/dL — ABNORMAL HIGH (ref 70–99)
Potassium: 3.3 mmol/L — ABNORMAL LOW (ref 3.5–5.1)
Sodium: 136 mmol/L (ref 135–145)

## 2023-03-29 LAB — CBC WITH DIFFERENTIAL/PLATELET
Abs Immature Granulocytes: 0.03 10*3/uL (ref 0.00–0.07)
Basophils Absolute: 0 10*3/uL (ref 0.0–0.1)
Basophils Relative: 0 %
Eosinophils Absolute: 0.1 10*3/uL (ref 0.0–0.5)
Eosinophils Relative: 1 %
HCT: 44.5 % (ref 39.0–52.0)
Hemoglobin: 15.2 g/dL (ref 13.0–17.0)
Immature Granulocytes: 0 %
Lymphocytes Relative: 17 %
Lymphs Abs: 1.8 10*3/uL (ref 0.7–4.0)
MCH: 29.3 pg (ref 26.0–34.0)
MCHC: 34.2 g/dL (ref 30.0–36.0)
MCV: 85.9 fL (ref 80.0–100.0)
Monocytes Absolute: 0.9 10*3/uL (ref 0.1–1.0)
Monocytes Relative: 9 %
Neutro Abs: 7.5 10*3/uL (ref 1.7–7.7)
Neutrophils Relative %: 73 %
Platelets: 154 10*3/uL (ref 150–400)
RBC: 5.18 MIL/uL (ref 4.22–5.81)
RDW: 13.1 % (ref 11.5–15.5)
WBC: 10.3 10*3/uL (ref 4.0–10.5)
nRBC: 0 % (ref 0.0–0.2)

## 2023-03-29 MED ORDER — IOHEXOL 300 MG/ML  SOLN
100.0000 mL | Freq: Once | INTRAMUSCULAR | Status: AC | PRN
  Administered 2023-03-29: 100 mL via INTRAVENOUS

## 2023-03-29 MED ORDER — LIDOCAINE-EPINEPHRINE (PF) 2 %-1:200000 IJ SOLN
10.0000 mL | Freq: Once | INTRAMUSCULAR | Status: AC
  Administered 2023-03-29: 10 mL
  Filled 2023-03-29: qty 20

## 2023-03-29 MED ORDER — DOXYCYCLINE HYCLATE 100 MG PO CAPS: 100.0000 mg | ORAL_CAPSULE | Freq: Two times a day (BID) | ORAL | 0 refills | Status: DC

## 2023-03-29 MED ORDER — SODIUM CHLORIDE (PF) 0.9 % IJ SOLN
INTRAMUSCULAR | Status: AC
  Filled 2023-03-29: qty 50

## 2023-03-29 MED ORDER — DOXYCYCLINE HYCLATE 100 MG PO CAPS: 100.0000 mg | ORAL_CAPSULE | Freq: Two times a day (BID) | ORAL | 0 refills | Status: AC

## 2023-03-29 MED ORDER — DOXYCYCLINE HYCLATE 100 MG PO TABS
100.0000 mg | ORAL_TABLET | Freq: Once | ORAL | Status: AC
  Administered 2023-03-29: 100 mg via ORAL
  Filled 2023-03-29: qty 1

## 2023-03-29 NOTE — ED Provider Notes (Signed)
Signout from [employer] at shift change. Briefly, patient presents for rectal pain for about 1 day.  Patient initially thought there is a hemorrhoid.  He has had a rectal abscess in the past which required drainage in the OR.  This was about 20 years ago.  He has a history of diabetes.   Plan: Awaiting CT results   7:19 AM Reassessment performed. Patient appears stable.  External exam performed with RN chaperone.  There is some mild induration and tenderness noted but no external fluctuant areas.  Personally reviewed and interpreted CT imaging, agree abscess noted.   Most current vital signs reviewed and are as follows: BP (!) 155/95 (BP Location: Left Arm)   Pulse 91   Temp 98.7 F (37.1 C) (Oral)   Resp 16   Ht 5\' 9"  (1.753 m)   Wt 99.8 kg   SpO2 96%   BMI 32.49 kg/m   Plan: Will discuss with general surgery as currently the area is not amenable to a bedside I&D.  Patient last ate 6 PM yesterday.   8:15 AM Reassessment performed. Patient appears stable.  I discussed case with Dr. Dwain Sarna who looked at the imaging, advised bedside I&D in the ED, antibiotics, packing for 2 days if able.   Patient discussed with and seen by Dr. Durwin Nora.  We used ultrasound to best localize the area of fluid.  Needle aspiration performed.  Procedure per his note.   Plan: Discharge home on doxycycline.  He would like to use over-the-counter medication for pain.  First dose given in the ED.  Patient will need to perform warm soaks 2-3 times a day for 15 minutes.  Patient is aware that if the symptoms begin to worsen again that he should come back to the emergency department or follow-up with general surgery.  Return sooner with fevers.       Renne Crigler, PA-C 03/29/23 0820    Gloris Manchester, MD 03/29/23 [phone]

## 2023-03-29 NOTE — Discharge Instructions (Signed)
Please read and follow all provided instructions.  Your diagnoses today include:  1. Perianal abscess    Tests performed today include: Vital signs. See below for your results today.   Medications prescribed:  Doxycycline - antibiotic  You have been prescribed an antibiotic medicine: take the entire course of medicine even if you are feeling better. Stopping early can cause the antibiotic not to work.  Please use over-the-counter NSAID medications (ibuprofen, naproxen) or Tylenol (acetaminophen) as directed on the packaging for pain -- as long as you do not have any reasons avoid these medications. Reasons to avoid NSAID medications include: weak kidneys, a history of bleeding in your stomach or gut, or uncontrolled high blood pressure or previous heart attack. Reasons to avoid Tylenol include: liver problems or ongoing alcohol use. Never take more than 4000mg  or 8 Extra strength Tylenol in a 24 hour period.     Take any prescribed medications only as directed.   Home care instructions:  Follow any educational materials contained in this packet  Follow-up instructions: Return to the Emergency Department in 48 hours for a recheck if your symptoms are not significantly improved.  Return instructions:  Return to the Emergency Department if you have: Fever Worsening symptoms Worsening pain Worsening swelling Redness of the skin that moves away from the affected area, especially if it streaks away from the affected area  Any other emergent concerns  Your vital signs today were: BP (!) 151/99   Pulse 88   Temp 98.4 F (36.9 C) (Oral)   Resp 16   Ht 5\' 9"  (1.753 m)   Wt 99.8 kg   SpO2 96%   BMI 32.49 kg/m  If your blood pressure (BP) was elevated above 135/85 this visit, please have this repeated by your doctor within one month. --------------

## 2023-03-29 NOTE — ED Triage Notes (Signed)
Pt arrived POV for possible rectal abscess or possible rectal hemorrhoids. Pt reports rectal pain since yesterday. Hx of rectal abscess many years ago.

## 2023-03-29 NOTE — ED Provider Notes (Signed)
[place] [facility] Provider Note   CSN: [id number] Arrival date & time: 03/29/23  0347     History  Chief Complaint  Patient presents with   Abscess    Steven James is a 57 y.o. male.  57 year old male presents with complaint of rectal pain for the past few days. Initially thought pain was due to hemorrhoids, now feels pain is more like when he had a rectal abscess 22 years ago (drained in the ER, bounced back with sepsis and required OR I&D). Reports diarrhea for the past few days, no pain with bowel movements.        Home Medications Prior to Admission medications   Medication Sig Start Date End Date Taking? Authorizing Provider  acetaminophen (TYLENOL) 500 MG tablet Take 1,000 mg by mouth every 8 (eight) hours as needed for mild pain or headache.    [provider]  amphetamine-dextroamphetamine (ADDERALL) 10 MG tablet Take 10 mg by mouth 3 (three) times daily.    [provider]  ciprofloxacin (CIPRO) 500 MG tablet Take 1 tablet (500 mg total) by mouth every 12 (twelve) hours. 05/05/14   Raeford Razor, MD  doxycycline (VIBRAMYCIN) 100 MG capsule Take 1 capsule (100 mg total) by mouth 2 (two) times daily. 12/08/21   Tomi Bamberger, PA-C  FLUoxetine (PROZAC) 10 MG capsule Take 10 mg by mouth daily.    [provider]  losartan (COZAAR) 25 MG tablet Take 25 mg by mouth daily.    [provider]  oxyCODONE-acetaminophen (PERCOCET/ROXICET) 5-325 MG per tablet Take 1-2 tablets by mouth every 4 (four) hours as needed for severe pain. 05/05/14   Raeford Razor, MD  zolpidem (AMBIEN) 10 MG tablet Take 10 mg by mouth at bedtime as needed for sleep.    [provider]      Allergies    Horse-derived products    Review of Systems   Review of Systems Negative except as per HPI Physical Exam Updated Vital Signs BP (!) 155/95 (BP Location: Left Arm)   Pulse 91   Temp 98.7 F (37.1 C) (Oral)   Resp  16   Ht 5\' 9"  (1.753 m)   Wt 99.8 kg   SpO2 96%   BMI 32.49 kg/m  Physical Exam Vitals and nursing note reviewed. Exam conducted with a chaperone present.  Constitutional:      General: He is not in acute distress.    Appearance: He is well-developed. He is not diaphoretic.  HENT:     Head: Normocephalic and atraumatic.  Pulmonary:     Effort: Pulmonary effort is normal.  Genitourinary:      Comments: Tenderness posteriorly, appears to have scar to area possibly from prior I&D, no active drainage, no overlying erythema  Neurological:     Mental Status: He is alert and oriented to person, place, and time.  Psychiatric:        Behavior: Behavior normal.     ED Results / Procedures / Treatments   Labs (all labs ordered are listed, but only abnormal results are displayed) Labs Reviewed  BASIC METABOLIC PANEL - Abnormal; Notable for the following components:      Result Value   Potassium 3.3 (*)    CO2 21 (*)    Glucose, Bld 127 (*)    All other components within normal limits  CBC WITH DIFFERENTIAL/PLATELET    EKG None  Radiology No results found.  Procedures Procedures  Medications Ordered in ED Medications  iohexol (OMNIPAQUE) 300 MG/ML solution 100 mL (100 mLs Intravenous Contrast Given 03/29/23 1610)    ED Course/ Medical Decision Making/ A&P                             Medical Decision Making  57 yo male with rectal pain for the past few days, concern for hemorrhoid vs rectal abscess. Hx of rectal abscess 20+ years ago treated in the OR. Chaperone present for exam, tenderness posteriorly without overlying erythema, no drainage. Labs and CT ordered for further evaluation with concern for rectal abscess. Labs with normal WBC, afebrile. BMP with mild hypokalemia. CT a/p with contrast pending at time of sign out to on coming provider.         Final Clinical Impression(s) / ED Diagnoses Final diagnoses:  Rectal pain    Rx / DC Orders ED  Discharge Orders     None         Alden Hipp 03/29/23 9604    Nira Conn, MD 03/29/23 [phone]
